# Patient Record
Sex: Female | Born: 1995 | Race: White | Hispanic: No | Marital: Single | Smoking: Never smoker
Health system: Southern US, Community
[De-identification: ages and names within clinical notes are randomized; demographics above are authoritative.]

## PROBLEM LIST (undated history)

## (undated) DIAGNOSIS — B019 Varicella without complication: Secondary | ICD-10-CM

## (undated) DIAGNOSIS — K219 Gastro-esophageal reflux disease without esophagitis: Secondary | ICD-10-CM

## (undated) HISTORY — DX: Varicella without complication: B01.9

## (undated) HISTORY — DX: Gastro-esophageal reflux disease without esophagitis: K21.9

---

## 2019-05-21 ENCOUNTER — Other Ambulatory Visit: Payer: Self-pay

## 2019-05-23 ENCOUNTER — Ambulatory Visit (INDEPENDENT_AMBULATORY_CARE_PROVIDER_SITE_OTHER): Payer: BC Managed Care – PPO | Admitting: Internal Medicine

## 2019-05-23 ENCOUNTER — Ambulatory Visit: Payer: Self-pay | Admitting: Internal Medicine

## 2019-05-23 ENCOUNTER — Other Ambulatory Visit: Payer: Self-pay

## 2019-05-23 ENCOUNTER — Encounter: Payer: Self-pay | Admitting: Internal Medicine

## 2019-05-23 VITALS — BP 110/70 | HR 75 | Ht 61.5 in | Wt 190.0 lb

## 2019-05-23 DIAGNOSIS — D352 Benign neoplasm of pituitary gland: Secondary | ICD-10-CM

## 2019-05-23 DIAGNOSIS — E221 Hyperprolactinemia: Secondary | ICD-10-CM | POA: Diagnosis not present

## 2019-05-23 DIAGNOSIS — E229 Hyperfunction of pituitary gland, unspecified: Secondary | ICD-10-CM | POA: Diagnosis not present

## 2019-05-23 DIAGNOSIS — R7989 Other specified abnormal findings of blood chemistry: Secondary | ICD-10-CM

## 2019-05-23 LAB — TSH: TSH: 2.6 u[IU]/mL (ref 0.35–4.50)

## 2019-05-23 LAB — PROLACTIN: Prolactin: 34.6 ng/mL — ABNORMAL HIGH

## 2019-05-23 NOTE — Patient Instructions (Signed)
Please stop at the lab.  Move Cabergoline at night.  I will let you know abut the Cabergoline dose when the results return.  Please return for another prolactin level in 1.5-2 months.  Please come back for a follow-up appointment in 1 year.

## 2019-05-23 NOTE — Progress Notes (Signed)
Patient ID: Victoria Sellers, female   DOB: 03-Aug-1996, 23 y.o.   MRN: [id number]    HPI  Victoria Sellers is a 23 y.o.-year-old female, referred by her PCP, Dr. 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  She moved from [place] and would like to establish care with endocrinology in [place].  Pt. has been found to have a high prolactin level in 2019 after she complained of bilateral breast discharge (for the previous 4 years).   At that time, she presented with: -Galactorrhea -Irregular menstrual cycles (skipped 6 months last year, prev. q3-4 months) -Weight gain (30 lbs in last 5 years) -Acne But no headaches.  Further investigation with a pituitary MRI was positive for a pituitary adenoma: Pituitary MRI (05/18/2018): 7 x 8 mm pituitary microadenoma within the right aspect of the pituitary gland  She was referred to endocrinology (Dr. Georgiann Mccoy, DO), and she was started on cabergoline.  She continues on 0.5 mg of Cabergoline twice a week (however, she was off the medication for the last 3 weeks since this was not refilled by previous endocrinologist due to her impending move to [place]).  She takes this in the morning.  She tolerates it well.  She denies: -Headaches -Dizziness -Congestion -Nausea But she does have: -Constipation  Also, her galactorrhea and irregular menses resolved after starting Cabergoline.  She describes that since she was Cabergoline, she started to have mild breast discharge again.  Patient prolactin levels were reviewed: 11/09/2018: Prolactin 14.23 10/15/2018: Prolactin 19.94 05/22/2018: FSH 6.2, LH 7.9, alpha subunit 0.1, IGF-I 133, ACTH 18, a.m. cortisol 10, free T4 1.09 -all normal 05/14/2018: Prolactin 105.7, TSH 1.75 No results found for: PROLACTIN  Patient's thyroid tests were also reviewed and these were normal: 05/14/2018: TSH 1.75 No results found for: TSH, FREET4   She does not have a history of  hypothyroidism. Mother has hypothyroidism.  She is not on Risperdal, oral contraceptives, Reglan.  Pt. also has a history of L femoral fx - playing soccer. She had surgery.  ROS: Constitutional: + weight gain, no weight loss, no fatigue, no subjective hyperthermia, no subjective hypothermia, no nocturia Eyes: no blurry vision, no xerophthalmia ENT: no sore throat, no nodules felt in neck, no dysphagia, no odynophagia, no hoarseness, no tinnitus, no hypoacusis Cardiovascular: no CP, no SOB, no palpitations, no leg swelling Respiratory: no cough, no SOB, no wheezing Gastrointestinal: no N, no V, no D, no C, no acid reflux Musculoskeletal: no muscle, no joint aches Skin: no rash, no hair loss Neurological: no tremors, no numbness or tingling/no dizziness/no HAs Psychiatric: no depression, no anxiety  Past medical history: Please see HPI  History reviewed. No pertinent surgical history.   Social History   Socioeconomic History  . Marital status: Single    Spouse name: Not on file  . Number of children: Not on file  . Years of education: Not on file  . Highest education level: Not on file  Occupational History  . Not on file  Social Needs  . Financial resource strain: Not on file  . Food insecurity    Worry: Not on file    Inability: Not on file  . Transportation needs    Medical: Not on file    Non-medical: Not on file  Tobacco Use  . Smoking status: Never Smoker  . Smokeless tobacco: Never Used  Substance and Sexual Activity  . Alcohol use: Not on file  . Drug use: Not on file  . Sexual activity:  Not on file  Lifestyle  . Physical activity    Days per week: Not on file    Minutes per session: Not on file  . Stress: Not on file  Relationships  . Social Herbalist on phone: Not on file    Gets together: Not on file    Attends religious service: Not on file    Active member of club or organization: Not on file    Attends meetings of clubs or  organizations: Not on file    Relationship status: Not on file  . Intimate partner violence    Fear of current or ex partner: Not on file    Emotionally abused: Not on file    Physically abused: Not on file    Forced sexual activity: Not on file  Other Topics Concern  . Not on file  Social History Narrative  . Not on file   Current Outpatient Medications  Medication Sig Dispense Refill  . cabergoline (DOSTINEX) 0.5 MG tablet Take 0.5 mg by mouth 2 (two) times a week.     No current facility-administered medications for this visit.     No Known Allergies   History reviewed. No pertinent family history.  Also, per HPI.  PE: BP 110/70   Pulse 75   Ht 5' 1.5" (1.562 m) Comment: measured today without shoes  Wt 190 lb (86.2 kg)   LMP 04/23/2019   SpO2 97%   BMI 35.32 kg/m  Wt Readings from Last 3 Encounters:  05/23/19 190 lb (86.2 kg)   Constitutional: overweight, in NAD Eyes: PERRLA, EOMI, no exophthalmos ENT: moist mucous membranes, no thyromegaly, no cervical lymphadenopathy Cardiovascular: RRR, No MRG Respiratory: CTA B Gastrointestinal: abdomen soft, NT, ND, BS+ Musculoskeletal: no deformities, strength intact in all 4 Skin: moist, warm, no rashes Neurological: no tremor with outstretched hands, DTR normal in all 4  ASSESSMENT: 1.  Hyperprolactinemia Patient with several years of galactorrhea, found to be related to hyperprolactinemia approximately 1 year ago.  We discussed about possible etiologies prolactin levels to include: A pituitary adenoma, pregnancy, hypothyroidism, stress, exercise, lack of sleep, certain medications, drugs, seizures, however, in her case, the most likely etiology is her pituitary microadenoma. -We also discussed about the effects of having a high prolactin: Galactorrhea, irregular menstrual cycles, increased weight, possibly acne/hirsutism, possibly headaches if related to a pituitary tumor -She did have galactorrhea, irregular menstrual  cycles, increased weight, and headaches which are all improved after starting the dopamine agonist (Cabergoline).  She is tolerating this well.  We discussed that this is the treatment of choice for hyperprolactinemia caused by a pituitary tumor. - we will recheck prolactin today, along with a TSH.  Of note, she has been off Cabergoline for approximately 3 weeks after he ran out of the medication.  She started to have breast discharge again, but mild. - We discussed about continuing Cabergoline for now but may need to change the dose based on her prolactin level.  Cabergoline has the advantage of being administered usually 1-2 times a week, is better tolerated, and has beneficial effects on pituitary tumor size.  Bromocriptine is taken several times a day, has more side effects, and has no effect on pituitary tumor size.  She agrees with the plan to start cabergoline if still needed - RTC for repeat prolactin level in 2 months if we change the dose of the cabergoline, and in 1 year for another visit  2. Pituitary microadenoma - Patient with pituitary  microadenoma,found during investigation for high prolactin level. - I reviewed the pituitary MRI report along with the patient. I explained that, since the tumor is under 1 cm, this qualifies as a micro-, rather than a macro-adenoma.  - I explained that this is not a "brain tumor". These tumors are extremely rarely malignant, the vast majority of them are benign.  - a pituitary adenoma can be:  Not producing hormones, not compressing the pituitary gland or the optic chiasm  Not producing hormones, but compressing either the pituitary gland (causing hypopituitarism) or the optic chiasm (causing visual field cuts)  Producing hormones: - Prolactin (prolactinoma) -usually manifests with galactorrhea, high prolactin level and irregular menses - ACTH (Cushing's disease) -usually manifests with weight gain-with specific central distribution of fat, wide,  purple, stretch marks, full supraclavicular fat pads, diabetes, hypertension.  Of note, her ACTH and cortisol level were normal. - growth hormone (acromegaly) - she denies an enlarged jaw, change in the size of rings or changing shoe sizes.  Her IGF-I level was normal. - LH or FSH (gonadotropin secreting tumor) -usually manifests with irregular menses in women, hyper/hypogonadism in men.  Her LH and FSH are normal, and she is now having regular menstrual cycles. - TSH (TSH secreting tumor) (very rare) -but TFTs and alpha subunit normal -We discussed about follow-up for pituitary tumor.  As long as her prolactin level remains controlled, we do not need to repeat the MRI. -We can discuss about a drug holiday approximately in 3 to 5 years if prolactin levels remain well suppressed until then.  Needs refills.  Component     Latest Ref Rng & Units 05/23/2019  TSH     0.35 - 4.50 uIU/mL 2.60  Prolactin     ng/mL 34.6 (H)  TSH is normal.  Prolactin is slightly high.  We will start Cabergoline at 0.25 mg twice a week and repeat her prolactin level in 1.5 to 2 months.  Philemon Kingdom, MD PhD [facility]

## 2019-05-24 ENCOUNTER — Other Ambulatory Visit: Payer: Self-pay | Admitting: Internal Medicine

## 2019-05-24 DIAGNOSIS — E221 Hyperprolactinemia: Secondary | ICD-10-CM

## 2019-05-24 MED ORDER — CABERGOLINE 0.5 MG PO TABS: 0.2500 mg | ORAL_TABLET | ORAL | 3 refills | Status: DC

## 2019-05-29 ENCOUNTER — Ambulatory Visit (INDEPENDENT_AMBULATORY_CARE_PROVIDER_SITE_OTHER): Payer: BC Managed Care – PPO | Admitting: Family Medicine

## 2019-05-29 ENCOUNTER — Encounter: Payer: Self-pay | Admitting: Family Medicine

## 2019-05-29 ENCOUNTER — Other Ambulatory Visit: Payer: Self-pay

## 2019-05-29 VITALS — BP 98/66 | HR 82 | Temp 96.7°F | Wt 189.0 lb

## 2019-05-29 DIAGNOSIS — R41840 Attention and concentration deficit: Secondary | ICD-10-CM | POA: Diagnosis not present

## 2019-05-29 DIAGNOSIS — R635 Abnormal weight gain: Secondary | ICD-10-CM

## 2019-05-29 DIAGNOSIS — D352 Benign neoplasm of pituitary gland: Secondary | ICD-10-CM | POA: Diagnosis not present

## 2019-05-29 DIAGNOSIS — Z7689 Persons encountering health services in other specified circumstances: Secondary | ICD-10-CM

## 2019-05-29 NOTE — Patient Instructions (Signed)

## 2019-05-29 NOTE — Progress Notes (Signed)
Patient presents to clinic today to establish care.  SUBJECTIVE: [name]: [name] with pmh sig for pituitary adenoma and GERD.  Pt previously seen by Victoria Sellers in [place], [place].  Pituitary adenoma: -d/x'd 1 yr ago -had amenorrhea and lactation at time of dx -was on cabergoline prior to moving to [place]. -had labs during recent visit with Dr. Cruzita Sellers -is to restart Caberoline soon  Attention concerns: -pt endorses waiting until the last minute to complete tasks -notes as a child her mother did not want her on meds. -having difficulty getting work done since working from home. -has never been on meds, but is interested in starting.  Weight gain: -pt was 130 lbs during college -feels like gained 20 lbs this yr -was more active in HS and college (ran, played soccer) -signed up to start [employer] -not cooking at home much.  States her roommates do not clean up the kitchen.  Allergies: nkda  Past Surg hx: L femur fx while playing soccer  Social hx:  Pt is single.  [place] is originally [place].  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  Pt denies tobacco and drug use.  Pt endorses social EtOH use.  Health Maintenance: Immunizations -- influenza 2019 PAP -- 2019  Family Med Hx: Mom- alive, asthma, thyroid issues Dad-alive, learning disability Sis- [employer]- alive, depression MGM-alive, EtOH abuse MGF-desc, MI, early death PGM-desc, DM PGF-alive, DM  Past Medical History:  Diagnosis Date  . Chicken pox   . GERD (gastroesophageal reflux disease)     History reviewed. No pertinent surgical history.  Current Outpatient Medications on File Prior to Visit  Medication Sig Dispense Refill  . cabergoline (DOSTINEX) 0.5 MG tablet Take 0.5 tablets (0.25 mg total) by mouth 2 (two) times a week. 12 tablet 3   No current facility-administered medications on file prior to visit.     No Known Allergies  History reviewed. No pertinent  family history.  Social History   Socioeconomic History  . Marital status: Single    Spouse name: Not on file  . Number of children: Not on file  . Years of education: Not on file  . Highest education level: Not on file  Occupational History  . Not on file  Social Needs  . Financial resource strain: Not on file  . Food insecurity    Worry: Not on file    Inability: Not on file  . Transportation needs    Medical: Not on file    Non-medical: Not on file  Tobacco Use  . Smoking status: Never Smoker  . Smokeless tobacco: Never Used  Substance and Sexual Activity  . Alcohol use: Yes  . Drug use: Never  . Sexual activity: Yes  Lifestyle  . Physical activity    Days per week: Not on file    Minutes per session: Not on file  . Stress: Not on file  Relationships  . Social Herbalist on phone: Not on file    Gets together: Not on file    Attends religious service: Not on file    Active member of club or organization: Not on file    Attends meetings of clubs or organizations: Not on file    Relationship status: Not on file  . Intimate partner violence    Fear of current or ex partner: Not on file    Emotionally abused: Not on file    Physically abused: Not  on file    Forced sexual activity: Not on file  Other Topics Concern  . Not on file  Social History Narrative  . Not on file    ROS General: Denies fever, chills, night sweats, changes in appetite   +weight gain, h/o pituitary adenoma HEENT: Denies headaches, ear pain, changes in vision, rhinorrhea, sore throat CV: Denies CP, palpitations, SOB, orthopnea Pulm: Denies SOB, cough, wheezing GI: Denies abdominal pain, nausea, vomiting, diarrhea, constipation GU: Denies dysuria, hematuria, frequency, vaginal discharge Msk: Denies muscle cramps, joint pains Neuro: Denies weakness, numbness, tingling Skin: Denies rashes, bruising Psych: Denies depression, anxiety, hallucinations  +difficulty concentrating.    BP 98/66 (BP Location: Left Arm, Patient Position: Sitting, Cuff Size: Large)   Pulse 82   Temp (!) 96.7 F (35.9 C) (Temporal)   Wt 189 lb (85.7 kg)   LMP 04/26/2019 (Exact Date)   SpO2 98%   BMI 35.13 kg/m   Physical Exam Gen. Pleasant, well developed, well-nourished, in NAD HEENT - Victoria [employer]ellers/AT, PERRL, no scleral icterus, no nasal drainage, pharynx without erythema or exudate.  TMs normal b/l Lungs: no use of accessory muscles, CTAB, no wheezes, rales or rhonchi Cardiovascular: RRR, No r/g/m, no peripheral edema Abdomen: BS present, soft, nontender,nondistended Musculoskeletal: No deformities, moves all four extremities, no cyanosis or clubbing, normal tone Neuro:  A&Ox3, CN II-XII intact, normal gait Skin:  Warm, dry, intact, no lesions   Recent Results (from the past 2160 hour(s))  TSH     Status: None   Collection Time: 05/23/19  1:32 PM  Result Value Ref Range   TSH 2.60 0.35 - 4.50 uIU/mL  Prolactin     Status: Abnormal   Collection Time: 05/23/19  1:34 PM  Result Value Ref Range   Prolactin 34.6 (H) ng/mL    Comment:             Reference Range  Females         Non-pregnant        3.0-30.0         Pregnant           10.0-209.0         Postmenopausal      2.0-20.0 . . .     Assessment/Plan: Pituitary adenoma (Victoria Sellers) -prolactin elevated at 34.6 on 05/23/19 -TSH normal at 2.60 on 05/23/19 -plans to restart Cabergoline -continue f/u with Endocrinology, Dr. Cruzita Sellers  Inattention -discussed ways to get organized a stay on task. -given handout -pt to contact [employer] Attention Specialist in regards to ADHD testing  Encounter to establish care -We reviewed the [name], PSH, FH, SH, Meds and Allergies. -We provided refills for any medications we will prescribe as needed. -We addressed current concerns per orders and patient instructions. -We have asked for records for pertinent exams, studies, vaccines and notes from previous providers. -We have advised patient to follow up  per instructions below.  Weight gain -last TSH 2.60 on 05/23/19 -discussed lifestyle modifications -will continue to evaluate  F/u prn  Victoria Mitts, MD

## 2019-05-31 ENCOUNTER — Encounter: Payer: Self-pay | Admitting: Family Medicine

## 2019-06-11 ENCOUNTER — Encounter: Payer: Self-pay | Admitting: Family Medicine

## 2019-06-18 NOTE — Telephone Encounter (Signed)
Spoke with pt provided pt with information regarding ADHD testing

## 2019-07-03 ENCOUNTER — Other Ambulatory Visit: Payer: BC Managed Care – PPO

## 2019-09-27 ENCOUNTER — Telehealth: Payer: Self-pay | Admitting: Internal Medicine

## 2019-09-27 NOTE — Telephone Encounter (Signed)
Patient requests to have thyroid lab orders added for her 10/01/19 appointment due to hair loss. Best ph# for patient is [phone]

## 2019-10-01 ENCOUNTER — Encounter: Payer: Self-pay | Admitting: Internal Medicine

## 2019-10-01 ENCOUNTER — Other Ambulatory Visit (INDEPENDENT_AMBULATORY_CARE_PROVIDER_SITE_OTHER): Payer: BC Managed Care – PPO

## 2019-10-01 ENCOUNTER — Other Ambulatory Visit: Payer: Self-pay

## 2019-10-01 ENCOUNTER — Other Ambulatory Visit: Payer: Self-pay | Admitting: Internal Medicine

## 2019-10-01 DIAGNOSIS — L659 Nonscarring hair loss, unspecified: Secondary | ICD-10-CM

## 2019-10-01 DIAGNOSIS — E221 Hyperprolactinemia: Secondary | ICD-10-CM | POA: Diagnosis not present

## 2019-10-02 LAB — T4, FREE: Free T4: 0.76 ng/dL (ref 0.60–1.60)

## 2019-10-02 LAB — TSH: TSH: 1.92 u[IU]/mL (ref 0.35–4.50)

## 2019-10-02 LAB — PROLACTIN: Prolactin: 19.8 ng/mL

## 2019-10-02 LAB — T3, FREE: T3, Free: 3.5 pg/mL (ref 2.3–4.2)

## 2020-01-02 ENCOUNTER — Ambulatory Visit: Payer: BC Managed Care – PPO | Attending: Family

## 2020-01-02 DIAGNOSIS — Z23 Encounter for immunization: Secondary | ICD-10-CM

## 2020-01-02 NOTE — Progress Notes (Signed)
   Covid-19 Vaccination Clinic  Name:  Victoria Sellers    MRN: [id number] DOB: 05-26-1996  01/02/2020  Victoria Sellers was observed post Covid-19 immunization for 15 minutes without incident. She was provided with Vaccine Information Sheet and instruction to access the V-Safe system.   Victoria Sellers was instructed to call 911 with any severe reactions post vaccine: Marland Kitchen Difficulty breathing  . Swelling of face and throat  . A fast heartbeat  . A bad rash all over body  . Dizziness and weakness   Immunizations Administered    Name Date Dose VIS Date Route   Moderna COVID-19 Vaccine 01/02/2020  4:23 PM 0.5 mL 08/2019 Intramuscular   Manufacturer: Moderna   Lot: [id number]   [id number]

## 2020-01-03 ENCOUNTER — Encounter: Payer: Self-pay | Admitting: Family Medicine

## 2020-01-03 ENCOUNTER — Telehealth (INDEPENDENT_AMBULATORY_CARE_PROVIDER_SITE_OTHER): Payer: BC Managed Care – PPO | Admitting: Family Medicine

## 2020-01-03 DIAGNOSIS — R6883 Chills (without fever): Secondary | ICD-10-CM

## 2020-01-03 DIAGNOSIS — Z7189 Other specified counseling: Secondary | ICD-10-CM

## 2020-01-03 DIAGNOSIS — M791 Myalgia, unspecified site: Secondary | ICD-10-CM | POA: Diagnosis not present

## 2020-01-03 NOTE — Progress Notes (Signed)
Virtual Visit via Video Note  I connected with Victoria Sellers on 01/03/20 at  1:00 PM EDT by a video enabled telemedicine application 2/2 [id number] pandemic and verified that I am speaking with the correct person using two identifiers.  Location patient: home Location provider:work or home office Persons participating in the virtual visit: patient, provider  I discussed the limitations of evaluation and management by telemedicine and the availability of in person appointments. The patient expressed understanding and agreed to proceed.   HPI: Pt is a 24 yo female with pmh sig for hyperprolactinemia 2/2 pituitary adenoma on cabergoline and GERD.  Pt received her 1st dose of Moderna COVID vaccine yesterday.  She developed chills, myalgias, injection site pain, unsure of fever as does not have temperature, nausea, and emesis around 2 am.  R arm extremely sore, no increased warmth or erythema.  Pt mentions only being able to use her R arm from the elbow down 2/2 the soreness.  Pt does note Denies HAs, sore throat, ear pain or pressure, cough.  Nausea has since resolved.  Has taken ibuprofen.   Denies sick contacts. Pt states she had to put on several layers of clothes to feel warm.  Will remove a layer as starting to feel hot.  ROS: See pertinent positives and negatives per HPI.  Past Medical History:  Diagnosis Date  . Chicken pox   . GERD (gastroesophageal reflux disease)     No past surgical history on file.  No family history on file.  Current Outpatient Medications:  .  cabergoline (DOSTINEX) 0.5 MG tablet, Take 0.5 tablets (0.25 mg total) by mouth 2 (two) times a week., Disp: 12 tablet, Rfl: 3  EXAM:  VITALS per patient if applicable: RR between 123456 bpm  GENERAL: alert, oriented, pt in several layers of clothes including a sweatshirt, appears fatigued, sick but non toxic and in NAD  HEENT: atraumatic, conjunctiva clear, no obvious abnormalities on inspection of external nose  and ears  NECK: normal movements of the head and neck  LUNGS: on inspection no signs of respiratory distress, breathing rate appears normal, no obvious gross SOB, gasping or wheezing  CV: no obvious cyanosis  MS: moves all visible extremities without noticeable abnormality  PSYCH/NEURO: pleasant and cooperative, no obvious depression or anxiety, speech and thought processing grossly intact  ASSESSMENT AND PLAN:  Discussed the following assessment and plan:  Educated about COVID-19 virus infection -Discussed signs and symptoms of COVID-19 virus -Discussed expected vaccine reactions including cold/flulike symptoms -Discussed supportive care including rest, hydration, Tylenol for any pain, fever or discomfort. -Given precautions  -For continued or worsening symptoms consider Covid testing.  Chills -2/2 expected vaccine rxn -Supportive care  Myalgia -2/2 expected vaccine rxn -Supportive care  Follow-up as needed   I discussed the assessment and treatment plan with the patient. The patient was provided an opportunity to ask questions and all were answered. The patient agreed with the plan and demonstrated an understanding of the instructions.   The patient was advised to call back or seek an in-person evaluation if the symptoms worsen or if the condition fails to improve as anticipated.   Billie Ruddy, MD

## 2020-01-28 ENCOUNTER — Ambulatory Visit: Payer: BC Managed Care – PPO | Attending: Family

## 2020-01-28 DIAGNOSIS — Z23 Encounter for immunization: Secondary | ICD-10-CM

## 2020-01-28 NOTE — Progress Notes (Signed)
   Covid-19 Vaccination Clinic  Name:  Victoria Sellers    MRN: [id number] DOB: December 11, 1995  01/28/2020  [name] was observed post Covid-19 immunization for 15 minutes without incident. She was provided with Vaccine Information Sheet and instruction to access the V-Safe system.   [name] was instructed to call 911 with any severe reactions post vaccine: Marland Kitchen Difficulty breathing  . Swelling of face and throat  . A fast heartbeat  . A bad rash all over body  . Dizziness and weakness   Immunizations Administered    Name Date Dose VIS Date Route   Moderna COVID-19 Vaccine 01/28/2020  3:40 PM 0.5 mL 08/2019 Intramuscular   Manufacturer: Moderna   Lot: [id number]   [id number]

## 2020-05-20 ENCOUNTER — Other Ambulatory Visit: Payer: Self-pay

## 2020-05-21 ENCOUNTER — Encounter: Payer: Self-pay | Admitting: Family Medicine

## 2020-05-21 ENCOUNTER — Ambulatory Visit (INDEPENDENT_AMBULATORY_CARE_PROVIDER_SITE_OTHER): Payer: BC Managed Care – PPO | Admitting: Family Medicine

## 2020-05-21 VITALS — BP 108/64 | HR 88 | Temp 98.0°F

## 2020-05-21 DIAGNOSIS — M79662 Pain in left lower leg: Secondary | ICD-10-CM | POA: Diagnosis not present

## 2020-05-21 DIAGNOSIS — S86112A Strain of other muscle(s) and tendon(s) of posterior muscle group at lower leg level, left leg, initial encounter: Secondary | ICD-10-CM | POA: Diagnosis not present

## 2020-05-21 NOTE — Patient Instructions (Addendum)
Medial Head Gastrocnemius Tear  Medial head gastrocnemius tear, also called tennis leg, is an injury to the inner part of the calf muscle. This injury may include overstretching of the muscle or a partial or complete tear. This is a common sports injury. Calf muscle tears usually occur near the back of the knee. This often causes sudden pain and muscle weakness. What are the causes? This condition is caused by forceful stretching or strain on the calf muscle. This usually happens when you forcefully push off of your foot. It may also happen if you forcefully straighten your knee while your foot is flat on the ground. What increases the risk? The following factors may make you more likely to develop this condition:  Being female and older than age 40.  Playing sports that involve: ? Quick increases in speed and changes of direction, such as tennis and soccer. ? Jumping, such as basketball. ? Running, especially uphill or on uneven ground. What are the signs or symptoms? Symptoms of this condition include:  Sudden pain in the back of the leg. You may hear a noise, like a pop or a snap at the time of injury.  Pain that gets worse when you bring your toes up toward your shin or when you straighten your knee.  Pain on the inside of your calf, from your knee to your ankle.  Pain when pressing on your calf muscle.  Swelling and bruising along your calf and lower leg, down to your ankle. This may worsen for the first 2 days before getting better.  Not being able to rise up on your toes.  Difficulty pushing off your foot when walking or using stairs. How is this diagnosed? This condition may be diagnosed based on:  Your symptoms and medical history.  A physical exam. Your health care provider may be able to feel a lump or a defect in your muscle.  An MRI or ultrasound to determine the severity and exact location of your injury. How is this treated? Treatment for this condition may  include:  Resting the muscle and keeping weight off your leg for several days. During this time, you may use crutches or another walking device.  Using a splint to keep your ankle or knee in a stable position.  Wearing a walking boot to decrease the use of your gastrocnemius muscle.  Using a wedge under your heel to reduce stretching of your healing muscle.  Wearing a compression sleeve around your calf muscle.  Icing the muscle.  Raising (elevating) your leg when resting.  Taking medicine for pain and swelling, such as NSAIDs or steroids.  Taking medicine for muscle spasms.  Doing leg exercises as told by your health care provider or physical therapist. Follow these instructions at home: Medicines  Take over-the-counter and prescription medicines only as told by your health care provider.  Ask your health care provider if the medicine prescribed to you requires you to avoid driving or using heavy machinery.  Talk with your health care provider before you take any medicines that contain aspirin. Aspirin increases your risk for bleeding at the injured area. If you have a splint, boot, or compression sleeve:  Wear it as told by your health care provider. Remove it only as told by your health care provider.  Loosen the splint, boot, or sleeve if your toes tingle, become numb, or turn cold and blue.  Keep the splint, boot, or sleeve clean.  If the splint, boot, or sleeve is not   waterproof: ? Do not let it get wet. ? Cover it with a watertight covering when you take a bath or shower. Managing pain, stiffness, and swelling   If directed, put ice on the injured area. ? If you have a removable splint, boot, or sleeve, remove it as told by your health care provider. ? Put ice in a plastic bag. ? Place a towel between your skin and the bag. ? Leave the ice on for 20 minutes, 2-3 times a day.  Move your toes often to reduce stiffness and swelling.  Elevate the injured area  above the level of your heart while you are sitting or lying down. Activity  Return gradually to your normal activities as told by your health care provider. Ask your health care provider what activities are safe for you.  Do not use the injured limb to support your body weight until your health care provider says that you can. Use crutches as told by your health care provider.  Do exercises as told by your health care provider.  Return to sporting activity only as told by your health care provider or physical therapist. Full recovery may take several months. General instructions  Ask your health care provider when it is safe to drive.  Do not use any products that contain nicotine or tobacco, such as cigarettes, e-cigarettes, and chewing tobacco. If you need help quitting, ask your health care provider.  Keep all follow-up visits as told by your health care provider. This is important. How is this prevented?  Warm up and stretch before being active.  Cool down and stretch after being active.  Give your body time to rest between periods of activity.  Make sure to use equipment that fits you.  Be safe and responsible while being active to avoid falls.  Maintain physical fitness, including: ? Strength. ? Flexibility. Contact a health care provider if:  Your symptoms do not improve with rest and treatment. Get help right away if:  You have swelling or redness in your calf that is getting worse.  Your skin or toenails turn blue or gray, feel cold, or become numb. Summary  Medial head gastrocnemius tear, also called tennis leg, is an injury to the inner part of the calf muscle.  Follow instructions as told by your health care provider for resting, icing, compressing, and elevating your leg.  Take over-the-counter and prescription medicines only as told by your health care provider.  Contact a health care provider if your symptoms do not improve with rest and  treatment. This information is not intended to replace advice given to you by your health care provider. Make sure you discuss any questions you have with your health care provider. Document Revised: 07/18/2018 Document Reviewed: 07/18/2018 Elsevier Patient Education  2020 [employer].  

## 2020-05-21 NOTE — Progress Notes (Signed)
Subjective:    Patient ID: Victoria Sellers, female    DOB: [birth year], 24 y.o.   MRN: [id number]  No chief complaint on file.   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  Pt endorses pulling L calf muscle at [employer] practice last Wed (8 days ago).  Pt was able to continue practicing and daily activities with some discomfort.  On Tuesday, 05/20/2019 while at practice pt heard a pop in L calf, leg gave out.  Patient describes the pain as being "shot in my leg" and was unable to bear weight.  Pt endorses edema, ecchymosis, pain, limited ROM of left ankle since the injury.  Pt tried ice, compression, elevation, and ibuprofen for symptoms.  Past Medical History:  Diagnosis Date  . Chicken pox   . GERD (gastroesophageal reflux disease)     No Known Allergies  ROS General: Denies fever, chills, night sweats, changes in weight, changes in appetite HEENT: Denies headaches, ear pain, changes in vision, rhinorrhea, sore throat CV: Denies CP, palpitations, SOB, orthopnea Pulm: Denies SOB, cough, wheezing GI: Denies abdominal pain, nausea, vomiting, diarrhea, constipation GU: Denies dysuria, hematuria, frequency, vaginal discharge Msk: Denies muscle cramps, joint pains  +L calf edema, ecchymosis, and pain Neuro: Denies weakness, numbness, tingling Skin: Denies rashes, bruising Psych: Denies depression, anxiety, hallucinations     Objective:    Blood pressure 108/64, pulse 88, temperature 98 F (36.7 C), temperature source Oral, SpO2 98 %.   Gen. Pleasant, well-nourished, in no distress, normal affect   HEENT: [place]/AT, face symmetric, conjunctiva clear, no scleral icterus, PERRLA, EOMI, nares patent without drainage Lungs: no accessory muscle use Cardiovascular: RRR, no m/r/g, no peripheral edema Abdomen: BS present, soft, NT/ND, no hepatosplenomegaly. Musculoskeletal: LLE  >RLE.  Left lower extremity compartments soft.  DP and PT pulses slightly diminished.good cap refill on left foot.  Limited ROM of left ankle 2/2 pain. dorsiflexion and plantar flexion of L foot decreased.  TTP of left calf.  No visible deformities.  Knee without TTP at joint line.  No cyanosis or clubbing. Neuro:  A&Ox3, CN II-XII intact, pt in transport wheelchair.  Ambulation not assessed Skin:  Warm, no lesions/ rash.  Skin of the left lower leg appears slightly shiny and tight compared to right.   Wt Readings from Last 3 Encounters:  05/29/19 189 lb (85.7 kg)  05/23/19 190 lb (86.2 kg)    Lab Results  Component Value Date   TSH 1.92 10/01/2019    Assessment/Plan:  Tear of left gastrocnemius muscle, initial encounter  - Plan: Ambulatory referral to Orthopedic Surgery  Pain of left calf -Likely 2/2 left gastrocnemius tear as unable to bear wt. -Compartments soft on exam pulses present -Discussed supportive care -Referral placed to Ortho for further evaluation and imaging.  Pt has appt today at 11 am [place]. - Plan: Ambulatory referral to Orthopedic Surgery  F/u prn  Grier Mitts, MD

## 2020-06-24 ENCOUNTER — Other Ambulatory Visit: Payer: Self-pay | Admitting: Internal Medicine

## 2020-06-24 NOTE — Telephone Encounter (Signed)
Needs appt for further refills.

## 2020-06-24 NOTE — Telephone Encounter (Signed)
Last OV 05/23/19 Last refill---1 year ago. Please advise for refill

## 2020-11-07 ENCOUNTER — Other Ambulatory Visit: Payer: Self-pay | Admitting: Internal Medicine

## 2020-12-13 ENCOUNTER — Other Ambulatory Visit: Payer: Self-pay | Admitting: Internal Medicine

## 2021-02-18 ENCOUNTER — Telehealth: Payer: Self-pay | Admitting: Internal Medicine

## 2021-02-18 NOTE — Telephone Encounter (Signed)
Pt calling in stating that she would like to donate plasma. Pt will be sending over a fax to [phone]

## 2021-02-18 NOTE — Telephone Encounter (Signed)
Noted  

## 2021-02-23 NOTE — Telephone Encounter (Signed)
Pt came on 02/23/21 at 12:13pm to drop off plasma paperwork, to be filled out by Dr.Gherghe.. placed in Provider's tray at front desk

## 2021-03-31 ENCOUNTER — Telehealth: Payer: Self-pay | Admitting: Internal Medicine

## 2021-03-31 NOTE — Telephone Encounter (Signed)
MEDICATION: cabergoline  PHARMACY:   [facility] [id number] - [place], [place] - [address] AT [place] Phone:  [phone]  Fax:  [phone]      HAS THE PATIENT CONTACTED THEIR PHARMACY?  no  IS THIS A 90 DAY SUPPLY : yes  IS PATIENT OUT OF MEDICATION: yes  IF NOT; HOW MUCH IS LEFT:   LAST APPOINTMENT DATE: '@6'$ /16/2022  NEXT APPOINTMENT DATE:'@8'$ /30/2022  DO WE HAVE YOUR PERMISSION TO LEAVE A DETAILED MESSAGE?: yes   **Let patient know to contact pharmacy at the end of the day to make sure medication is ready. **  ** Please notify patient to allow 48-72 hours to process**  **Encourage patient to contact the pharmacy for refills or they can request refills through [facility]**

## 2021-03-31 NOTE — Telephone Encounter (Signed)
Per Lenna Sciara, there will be no charge for this pt's paperwork completion this time.

## 2021-04-02 NOTE — Telephone Encounter (Signed)
Patient called to follow up with refill for cabergoline

## 2021-04-13 NOTE — Telephone Encounter (Signed)
Patient requests to be called asap at ph# [phone] re: Status of Forms/paperwork that Patient had dropped off in early June 2022 and was told on July 27th that the forms were ready to be picked up by the Patient, however, when Patient came in to pick up the forms on 04/07/21 no one could locate them.

## 2021-04-14 NOTE — Telephone Encounter (Signed)
Awaiting communication of where paperwork may possibly be. Will follow up with pt when possible.

## 2021-05-04 ENCOUNTER — Other Ambulatory Visit: Payer: Self-pay

## 2021-05-04 ENCOUNTER — Ambulatory Visit (INDEPENDENT_AMBULATORY_CARE_PROVIDER_SITE_OTHER): Payer: BC Managed Care – PPO | Admitting: Internal Medicine

## 2021-05-04 ENCOUNTER — Encounter: Payer: Self-pay | Admitting: Internal Medicine

## 2021-05-04 VITALS — BP 118/70 | HR 60 | Ht 61.5 in | Wt 184.6 lb

## 2021-05-04 DIAGNOSIS — E221 Hyperprolactinemia: Secondary | ICD-10-CM | POA: Diagnosis not present

## 2021-05-04 DIAGNOSIS — D352 Benign neoplasm of pituitary gland: Secondary | ICD-10-CM

## 2021-05-04 LAB — TSH: TSH: 2.25 u[IU]/mL (ref 0.35–5.50)

## 2021-05-04 NOTE — Patient Instructions (Addendum)
Please stop at the lab.  Continue off Cabergoline for now.  Please come back for a follow-up appointment in 1 year.

## 2021-05-04 NOTE — Progress Notes (Signed)
Patient ID: Victoria Sellers, female   DOB: 08-Jan-1996, 25 y.o.   MRN: [id number]   This visit occurred during the SARS-CoV-2 public health emergency.  Safety protocols were in place, including screening questions prior to the visit, additional usage of staff PPE, and extensive cleaning of exam room while observing appropriate contact time as indicated for disinfecting solutions.   HPI  Victoria Sellers is a 25 y.o.-year-old female, initially referred by her PCP, Dr. 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  She moved from [place] and would like to establish care with endocrinology in [place].  Our last visit was 2 years ago.  She was lost to follow-up afterwards.  Interim history: She is off Cabergoline for last 3 mo as she could not get refills.  She had to miss her last appointment as she developed COVID-19.  This was a mild form. She denies galactorrhea, headaches, visual changes, irregular menstrual cycles. No weight gain - she actually lost 5 lbs since last in-person visit.  She continues to have some acne associated with menses. She had a calf mm tear in 05/2020 while running (training for half marathon) >> now resolved.  She restarted training (the race is in [place] on 08/07/2021).  Reviewed and addended history: Pt. has been found to have a high prolactin level in 2019 after she complained of bilateral breast discharge (for the previous 4 years).   At that time, she presented with: -Galactorrhea -Irregular menstrual cycles (skipped 6 months last year, prev. q3-4 months) -Weight gain (30 lbs in last 5 years) -Acne But no headaches.  Further investigation with a pituitary MRI was positive for a pituitary adenoma: Pituitary MRI (05/18/2018): 7 x 8 mm pituitary microadenoma within the right aspect of the pituitary gland  She was referred to endocrinology (Dr. Georgiann Mccoy, DO), and she was started on cabergoline.  She  continued on 0.5 mg of Cabergoline twice a week.  Her galactorrhea and irregular menstrual cycles resolved after starting Cabergoline.    However, she was off the medication for 3 weeks before our visit from 2020, since this was not refilled by previous endocrinologist due to her impending move to [facility]).  She was taking this in the morning with good tolerance.   At last visit, she described that since she was Cabergoline, she started to have mild breast discharge again.  A prolactin level returned mildly elevated, at 34.6.    We restarted Cabergoline 0.25 mg twice a week in 05/2019.  A subsequent prolactin level was normal.  However, as mentioned above, she came off the medication in 01/2021 as she could not get refills anymore due to lack of follow-up.  Patient prolactin levels were reviewed: Lab Results  Component Value Date   PROLACTIN 19.8 10/01/2019   PROLACTIN 34.6 (H) 05/23/2019  11/09/2018: Prolactin 14.23 10/15/2018: Prolactin 19.94 05/22/2018: FSH 6.2, LH 7.9, alpha subunit 0.1, IGF-I 133, ACTH 18, a.m. cortisol 10, free T4 1.09 -all normal 05/14/2018: Prolactin 105.7, TSH 1.75  Patient's thyroid tests were also reviewed and these were normal: Lab Results  Component Value Date   TSH 1.92 10/01/2019   TSH 2.60 05/23/2019   FREET4 0.76 10/01/2019  05/14/2018: TSH 1.75  She does not have a history of hypothyroidism. Mother has hypothyroidism.  She is not on Risperdal, oral contraceptives, Reglan.  Pt. also has a history of L femoral fx - playing soccer. She had surgery.  ROS: Constitutional: no weight gain, no weight loss, no fatigue,  no subjective hyperthermia, no subjective hypothermia, no nocturia Eyes: no blurry vision, no xerophthalmia ENT: no sore throat, no nodules felt in neck, no dysphagia, no odynophagia, no hoarseness, no tinnitus, no hypoacusis Cardiovascular: no CP, no SOB, no palpitations, no leg swelling Respiratory: no cough, no SOB, no  wheezing Gastrointestinal: no N, no V, no D, no C, no acid reflux Musculoskeletal: no muscle, no joint aches Skin: no rash, no hair loss Neurological: no tremors, no numbness or tingling/no dizziness/no HAs  Past medical history: Please see HPI  No past surgical history on file.   Social History   Socioeconomic History   Marital status: Single    Spouse name: Not on file   Number of children: Not on file   Years of education: Not on file   Highest education level: Not on file  Occupational History   Not on file  Tobacco Use   Smoking status: Never   Smokeless tobacco: Never  Substance and Sexual Activity   Alcohol use: Yes   Drug use: Never   Sexual activity: Yes  Other Topics Concern   Not on file  Social History Narrative   Not on file   Social Determinants of Health   Financial Resource Strain: Not on file  Food Insecurity: Not on file  Transportation Needs: Not on file  Physical Activity: Not on file  Stress: Not on file  Social Connections: Not on file  Intimate Partner Violence: Not on file   Current Outpatient Medications  Medication Sig Dispense Refill   cabergoline (DOSTINEX) 0.5 MG tablet TAKE 1/2 TABLET BY MOUTH TWICE A WEEK 4 tablet 0   No current facility-administered medications for this visit.    No Known Allergies   No family history on file.  Also, per HPI.  PE: BP 118/70 (BP Location: Right Arm, Patient Position: Sitting, Cuff Size: Normal)   Pulse 60   Ht 5' 1.5" (1.562 m)   Wt 184 lb 9.6 oz (83.7 kg)   SpO2 98%   BMI 34.32 kg/m  Wt Readings from Last 3 Encounters:  05/04/21 184 lb 9.6 oz (83.7 kg)  05/29/19 189 lb (85.7 kg)  05/23/19 190 lb (86.2 kg)   Constitutional: overweight, in NAD Eyes: PERRLA, EOMI, no exophthalmos ENT: moist mucous membranes, no thyromegaly, no cervical lymphadenopathy Cardiovascular: RRR, No MRG Respiratory: CTA B Gastrointestinal: abdomen soft, NT, ND, BS+ Musculoskeletal: no deformities, strength  intact in all 4 Skin: moist, warm, no rashes Neurological: no tremor with outstretched hands, DTR normal in all 4  ASSESSMENT: 1.  Hyperprolactinemia Patient with history of several years of galactorrhea, found to be related to hyperprolactinemia in 2019.  During investigation for hyperprolactinemia, she was found to have a pituitary microadenoma. -we restarted Cabergoline (at the lower dose, 0.25 mg twice a week) at last visit after being off for approximately 3 weeks after she ran out of the medication.  She restarted to have breast discharge, but this was mild.  At that time, prolactin level was slightly high, at 34.  After restarting Cabergoline, I advised her to return in 1 to 2 months for a repeat prolactin level (this was normal, at 19) but she was lost for follow-up for 2 years afterwards -At last visit, we discussed about continuing Cabergoline for at least 5 years, and then possibly keeping her a drug holiday. -Of note, she tolerated Cabergoline well, without nausea, dizziness, headache, congestion -At this visit, she tells me that she had to come off Cabergoline after she  ran out of the medication in 01/2021 and could not refill it. -she denies headaches, irregular menstrual cycles, galactorrhea, acne, weight gain -We will recheck the prolactin level at this visit and see if she needs to restart Cabergoline.  We discussed that if her prolactin level is only slightly elevated, due to the lack of symptoms, we may just need to follow this up without needing to restart Cabergoline.  She agrees with the plan. -I will see her back in a year, but sooner for labs  2. Pituitary microadenoma -She has a history of pituitary microadenoma found during investigation for high prolactin level -An investigation for the rest of the pituitary function was normal (ACTH, cortisol, IGF-I, LH, FSH, TFTs).  We do not need to repeat these, since she does not have any clinical features of Cushing's disease,  acromegaly, or other pituitary tumors. -She denies blurry vision or headaches -As long as her prolactin levels remain controlled, we do not need to repeat the MRI -We again discussed about considering this 3 months off Cabergoline is a drug holiday and see how her prolactin level is changed during this period  Component     Latest Ref Rng & Units 05/04/2021  TSH     0.35 - 5.50 uIU/mL 2.25  Prolactin     ng/mL 37.5 (H)  TSH is normal but prolactin is slightly high.  Since she is asymptomatic, I would suggest to recheck her prolactin level in 1 to 2 months to see if we need to restart Cabergoline.  Philemon Kingdom, MD PhD [facility]

## 2021-05-05 LAB — PROLACTIN: Prolactin: 37.5 ng/mL — ABNORMAL HIGH

## 2021-05-20 ENCOUNTER — Encounter (HOSPITAL_COMMUNITY): Payer: Self-pay

## 2021-05-20 ENCOUNTER — Other Ambulatory Visit: Payer: Self-pay

## 2021-05-20 ENCOUNTER — Ambulatory Visit (HOSPITAL_COMMUNITY)
Admission: EM | Admit: 2021-05-20 | Discharge: 2021-05-20 | Disposition: A | Discharge status: Home / Self Care | Payer: BC Managed Care – PPO | Attending: Family Medicine | Admitting: Family Medicine

## 2021-05-20 DIAGNOSIS — L03032 Cellulitis of left toe: Secondary | ICD-10-CM | POA: Diagnosis not present

## 2021-05-20 MED ORDER — DOXYCYCLINE HYCLATE 100 MG PO CAPS: 100.0000 mg | ORAL_CAPSULE | Freq: Two times a day (BID) | ORAL | 0 refills | Status: AC

## 2021-05-20 NOTE — ED Triage Notes (Signed)
Pt presents with swelling and pain to her left big toe x 5 days. Reports concern for ingrown toenail. Has done warm soaks and ibuprofen at home with no relief.

## 2021-05-20 NOTE — ED Provider Notes (Signed)
  Victoria Sellers   [id number] 05/20/21 Arrival Time: [id number]  ASSESSMENT & PLAN:  1. Paronychia of great toe, left    No indication for I&D at this time. Very early development with fluctuance.  Begin: Meds ordered this encounter  Medications   doxycycline (VIBRAMYCIN) 100 MG capsule    Sig: Take 1 capsule (100 mg total) by mouth 2 (two) times daily.    Dispense:  14 capsule    Refill:  0    Recommend:  Follow-up Information     Schedule an appointment as soon as possible for a visit  with [facility]).   Why: If worsening or failing to improve as anticipated. Contact information: [address] [place],  [place]  [address]  [phone]                Reviewed expectations re: course of current medical issues. Questions answered. Outlined signs and symptoms indicating need for more acute intervention. Patient verbalized understanding. After Visit Summary given.   SUBJECTIVE:  Victoria Sellers is a 25 y.o. female who presents with a possible infection of her L great toe. Onset gradual, sev days ago; has trimmed toenail back with some relief; without active drainage and with mild bleeding. Symptoms have stabilized since beginning. Fever: absent. OTC/home treatment: none.  OBJECTIVE:  Vitals:   05/20/21 0922  BP: 118/71  Pulse: 69  Resp: 19  Temp: 98 F (36.7 C)  SpO2: 99%    General appearance: alert; no distress L great toe: erythema around medial nailfold; mild TTP; tender to touch; no active drainage or bleeding Psychological: alert and cooperative; normal mood and affect  No Known Allergies  Past Medical History:  Diagnosis Date   Chicken pox    GERD (gastroesophageal reflux disease)    Social History   Socioeconomic History   Marital status: Single    Spouse name: Not on file   Number of children: Not on file   Years of education: Not on file   Highest education level: Not on file  Occupational  History   Not on file  Tobacco Use   Smoking status: Never   Smokeless tobacco: Never  Substance and Sexual Activity   Alcohol use: Yes   Drug use: Never   Sexual activity: Yes  Other Topics Concern   Not on file  Social History Narrative   Not on file   Social Determinants of Health   Financial Resource Strain: Not on file  Food Insecurity: Not on file  Transportation Needs: Not on file  Physical Activity: Not on file  Stress: Not on file  Social Connections: Not on file   Family History  Problem Relation Age of Onset   Healthy Mother    Healthy Father    History reviewed. No pertinent surgical history.          Vanessa Kick, MD 05/20/21 1012

## 2021-06-29 ENCOUNTER — Other Ambulatory Visit: Payer: Self-pay

## 2021-06-29 ENCOUNTER — Other Ambulatory Visit (INDEPENDENT_AMBULATORY_CARE_PROVIDER_SITE_OTHER): Payer: BC Managed Care – PPO

## 2021-06-29 DIAGNOSIS — D352 Benign neoplasm of pituitary gland: Secondary | ICD-10-CM

## 2021-06-29 DIAGNOSIS — E221 Hyperprolactinemia: Secondary | ICD-10-CM

## 2021-06-30 LAB — PROLACTIN: Prolactin: 32 ng/mL — ABNORMAL HIGH

## 2021-10-14 ENCOUNTER — Encounter: Payer: Self-pay | Admitting: Family Medicine

## 2021-10-14 ENCOUNTER — Ambulatory Visit (INDEPENDENT_AMBULATORY_CARE_PROVIDER_SITE_OTHER): Payer: BC Managed Care – PPO | Admitting: Family Medicine

## 2021-10-14 VITALS — BP 110/62 | HR 70 | Temp 97.5°F | Wt 196.8 lb

## 2021-10-14 DIAGNOSIS — S060X0A Concussion without loss of consciousness, initial encounter: Secondary | ICD-10-CM

## 2021-10-14 DIAGNOSIS — D352 Benign neoplasm of pituitary gland: Secondary | ICD-10-CM | POA: Diagnosis not present

## 2021-10-14 DIAGNOSIS — T148XXA Other injury of unspecified body region, initial encounter: Secondary | ICD-10-CM

## 2021-10-14 NOTE — Progress Notes (Signed)
Subjective:    Patient ID: Victoria Sellers, female    DOB: 06-21-1996, 26 y.o.   MRN: [id number]  Chief Complaint  Patient presents with   Concussion    X 4 days, playing rugby, hit right temple     HPI Patient is a 26 year old female with past medical history significant for hyperprolactinemia, pituitary adenoma on cabergoline, GERD, h/o tear of left gastrocnemius muscle who was seen today for acute concern.  Pt concerned she may have a concussion.  Patient states 4 days ago while tackling another player during a rugby game she hit the right side of her head against the other player's knee.  Patient denies LOC at time of injury.  She was able to continue playing through the rest of the game.  Patient has since noticed edema of right temple, headache/increased head pressure, nausea, difficulty focusing, and lethargy.  Pain/pressure in head starts at right temple moves across frontal area and back to parietal areas.  Patient states that focusing in on something her peripheral vision disappears.  Patient denies emesis, insomnia, increased irritability.  Patient denies prior history of concussion or current use of blood thinners.  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  Pt inquires about checking on known h/o hyperprolactinemia she has not had recent imaging.  Followed by endocrinology.  Currently taking cabergoline.  Past Medical History:  Diagnosis Date   Chicken pox    GERD (gastroesophageal reflux disease)    No Known Allergies  ROS General: Denies fever, chills, night sweats, changes in weight, changes in appetite + lethargy HEENT: Denies headaches, ear pain, changes in vision, rhinorrhea, sore throat  + headache/increase head pressure CV: Denies CP, palpitations, SOB, orthopnea Pulm: Denies SOB, cough, wheezing GI: Denies abdominal pain, nausea, vomiting, diarrhea, constipation GU: Denies dysuria, hematuria, frequency, vaginal discharge Msk:  Denies muscle cramps, joint pains Neuro: Denies weakness, numbness, tingling Skin: Denies rashes, bruising + edema at right temple Psych: Denies depression, anxiety, hallucinations     Objective:    Blood pressure 110/62, pulse 70, temperature (!) 97.5 F (36.4 C), temperature source Oral, weight 196 lb 12.8 oz (89.3 kg), SpO2 98 %.  Gen. Pleasant, well-nourished, in no distress, normal affect   HEENT: [place]/AT, face symmetric, conjunctiva clear, no scleral icterus, PERRLA, EOMI, no nystagmus, nares patent without drainage, tongue midline, pharynx without erythema or exudate.  TMs normal bilaterally.  No cervical lymphadenopathy. Lungs: no accessory muscle use, CTAB, no wheezes or rales Cardiovascular: RRR, no m/r/g, no peripheral edema Abdomen: BS present, soft, NT/ND Musculoskeletal: Slightly decreased grip strength in right hand compared to left.  Normal strength in bilateral upper extremities, lower extremities, neck, and shoulders.  no deformities of skull, no cyanosis or clubbing, normal tone Neuro:  A&Ox3, CN II-XII intact, normal gait Skin:  Warm, no lesions/ rash.  No raccoon eyes or battle sign.  Soft tissue edema of right temple.   Wt Readings from Last 3 Encounters:  10/14/21 196 lb 12.8 oz (89.3 kg)  05/04/21 184 lb 9.6 oz (83.7 kg)  05/29/19 189 lb (85.7 kg)    Lab Results  Component Value Date   TSH 2.25 05/04/2021    Assessment/Plan:  Concussion without loss of consciousness, initial encounter  -Discussed symptoms likely associated with concussion.  No red flag symptoms. -Discussed mental rest and gradual return to play -Patient given a note for work -Tylenol as needed -Given strict precautions -For continued or worsening symptoms follow-up with concussion clinic. -  Plan: MR Brain Wo Contrast, Basic metabolic panel  Hematoma -Soft tissue edema of right temple improving -Supportive care including ice  Pituitary adenoma ([place])  -With history of  hyperprolactinemia -Currently asymptomatic -Continue cabergoline -Continue follow-up with endocrine -Last MRI brain 2019 - Plan: MR Brain Wo Contrast, Prolactin  F/u as needed  Grier Mitts, MD

## 2021-10-15 ENCOUNTER — Telehealth: Payer: Self-pay | Admitting: Family Medicine

## 2021-10-15 LAB — BASIC METABOLIC PANEL
BUN: 11 mg/dL (ref 6–23)
CO2: 28 mEq/L (ref 19–32)
Calcium: 9.3 mg/dL (ref 8.4–10.5)
Chloride: 104 mEq/L (ref 96–112)
Creatinine, Ser: 0.92 mg/dL (ref 0.40–1.20)
GFR: 86.4 mL/min (ref >60.00)
Glucose, Bld: 68 mg/dL — ABNORMAL LOW (ref 70–99)
Potassium: 4.2 mEq/L (ref 3.5–5.1)
Sodium: 139 mEq/L (ref 135–145)

## 2021-10-15 LAB — PROLACTIN: Prolactin: 30.9 ng/mL — ABNORMAL HIGH

## 2021-10-15 NOTE — Telephone Encounter (Signed)
Pt has viewed her blood work on [employer] and has questions

## 2021-10-18 NOTE — Telephone Encounter (Signed)
Blood sugar was 68 at time of blood draw.  Unclear on timing of last meal during that visit.  Would recommend an appointment to further evaluate.

## 2021-10-18 NOTE — Telephone Encounter (Signed)
Pt is concerned of low sugar levels. Pt stated she has a hx of low blood sugar but was never dx with hypoglycemia. Pt stated that she has been having fainting spells lately. Pt articulated that she tried to keep OJ and other foods high in sugar to keep from fainting.   Please advise

## 2021-10-19 NOTE — Telephone Encounter (Signed)
Patient notified of update  and verbalized understanding. Pt will call back once she gets her MRI scheduled.

## 2021-10-19 NOTE — Telephone Encounter (Signed)
Pt is aware [facility] will be calling her to sch MRI and that MRI has been authorization per sherri

## 2021-10-22 ENCOUNTER — Ambulatory Visit: Payer: BC Managed Care – PPO | Admitting: Family Medicine

## 2021-10-22 NOTE — Telephone Encounter (Signed)
Last Ov 10/14/21 Please advise

## 2021-10-22 NOTE — Telephone Encounter (Signed)
Patient is calling in to see if she needed to cancel her appointment today because she haven't got her MRI.  Please route back to me asap if she does so I cancel so she cannot get a penalty.  Please advise.

## 2021-10-25 ENCOUNTER — Ambulatory Visit
Admission: RE | Admit: 2021-10-25 | Discharge: 2021-10-25 | Disposition: A | Discharge status: Home / Self Care | Payer: BC Managed Care – PPO | Source: Ambulatory Visit | Attending: Family Medicine | Admitting: Family Medicine

## 2021-10-25 DIAGNOSIS — S060X0A Concussion without loss of consciousness, initial encounter: Secondary | ICD-10-CM

## 2021-10-25 DIAGNOSIS — D352 Benign neoplasm of pituitary gland: Secondary | ICD-10-CM

## 2021-10-25 NOTE — Telephone Encounter (Signed)
Matter addressed last week.  Patient was advised to proceed with imaging.

## 2021-10-27 ENCOUNTER — Encounter: Payer: Self-pay | Admitting: Family Medicine

## 2021-10-27 ENCOUNTER — Ambulatory Visit (INDEPENDENT_AMBULATORY_CARE_PROVIDER_SITE_OTHER): Payer: BC Managed Care – PPO | Admitting: Family Medicine

## 2021-10-27 VITALS — BP 128/83 | HR 84 | Temp 98.0°F | Wt 200.0 lb

## 2021-10-27 DIAGNOSIS — D352 Benign neoplasm of pituitary gland: Secondary | ICD-10-CM

## 2021-10-27 DIAGNOSIS — E162 Hypoglycemia, unspecified: Secondary | ICD-10-CM | POA: Diagnosis not present

## 2021-10-27 DIAGNOSIS — S060X0D Concussion without loss of consciousness, subsequent encounter: Secondary | ICD-10-CM | POA: Diagnosis not present

## 2021-10-27 NOTE — Progress Notes (Signed)
Subjective:    Patient ID: Victoria Sellers, female    DOB: 12/28/1995, 26 y.o.   MRN: [id number]  Chief Complaint  Patient presents with   Follow-up    MRI f/u and reevaluate concussion. Needs to be cleared before returning to work.    HPI Patient was seen today for f/u s/p concussion.  Concussion occurred 10/10/2021 during a rugby game.  Pt was able to continue playing in the game at time of injury.  Patient later seen in clinic on 10/14/2021 for symptoms including soft tissue edema right temple, headache, increased head pressure, nausea, difficulty focusing, lethargy, changes in peripheral vision.  Pt had MRI brain without contrast on 10/25/21 that was largely unremarkable.  Since last OFV patient reports resolution of symptoms.  No longer having headaches, photophobia, nausea, vomiting, fatigue.  Able to drive without issue.  Patient felt good during [employer].  Edema of right temple has resolved.  Pt mentions feeling a small bump underneath the skin on R temple.  Patient inquires about low blood sugar of 67 on labs from 10/14/2021.  Endorses history of hypoglycemia as a child after having a fasting blood test x3.  Patient eats regular meals but often notices dizziness/lightheadedness between meals.  Past Medical History:  Diagnosis Date   Chicken pox    GERD (gastroesophageal reflux disease)     No Known Allergies  ROS General: Denies fever, chills, night sweats, changes in weight, changes in appetite HEENT: Denies headaches, ear pain, changes in vision, rhinorrhea, sore throat CV: Denies CP, palpitations, SOB, orthopnea Pulm: Denies SOB, cough, wheezing GI: Denies abdominal pain, nausea, vomiting, diarrhea, constipation GU: Denies dysuria, hematuria, frequency, vaginal discharge Msk: Denies muscle cramps, joint pains Neuro: Denies weakness, numbness, tingling Skin: Denies rashes, bruising  +small bump on R side of head Psych: Denies depression, anxiety,  hallucinations    Objective:    Blood pressure 128/83, pulse 84, temperature 98 F (36.7 C), temperature source Oral, weight 200 lb (90.7 kg), SpO2 98 %.  Gen. Pleasant, well-nourished, in no distress, normal affect   HEENT: [place]/AT, face symmetric, conjunctiva clear, no scleral icterus, PERRLA, EOMI, nares patent without drainage, pharynx without erythema or exudate. TMs normal b/l.  no cervical lymphadenopathy. Lungs: no accessory muscle use, CTAB, no wheezes or rales Cardiovascular: RRR, no m/r/g, no peripheral edema Abdomen: BS present, soft, NT/ND Musculoskeletal: No deformities, no cyanosis or clubbing, normal tone Neuro:  A&Ox3, CN II-XII intact, normal gait Skin:  Warm, no lesions/ rash.  3-4 mm round, mobile area underneath skin of right temple without TTP.   Wt Readings from Last 3 Encounters:  10/27/21 200 lb (90.7 kg)  10/14/21 196 lb 12.8 oz (89.3 kg)  05/04/21 184 lb 9.6 oz (83.7 kg)    Lab Results  Component Value Date   GLUCOSE 68 (L) 10/14/2021   NA 139 10/14/2021   K 4.2 10/14/2021   CL 104 10/14/2021   CREATININE 0.92 10/14/2021   BUN 11 10/14/2021   CO2 28 10/14/2021   TSH 2.25 05/04/2021    Assessment/Plan:  Hypoglycemia -h/o hypoglycemia as a child -Glucose 68 on labs from 10/14/2021 -Discussed eating regular meals and snacks in between to avoid hypoglycemia as may be contributing to intermittent dizziness. -Patient encouraged to eat prior to her Rugby matches or practice. -consider glucose monitoring for continued or worsened symptoms  Concussion without loss of consciousness, subsequent encounter -resolved -no residual symptoms or deficits noted -Likely hematoma improving -MRI wo contrast 10/25/21 Limited assessment  for the reported pituitary adenoma on this noncontrast routine protocol brain MRI.  However there is a subtle asymmetric fullness of the right aspect of the pituitary gland, and this could reflect the presence of an underlying adenoma at  the site.  Otherwise unremarkable noncontrast MRI appearance of the brain.  Minimal mucosal thickening within bilateral ethmoid and left maxillary sinuses. -note given for return to work.  Pituitary adenoma ([place]) -Stable -Prolactin 30.9 on 10/14/2021, down from 32.0 on 06/29/2021 -Continue cabergoline 0.25 mg -Continue follow-up with endocrinology  F/u prn  Grier Mitts, MD

## 2021-12-24 ENCOUNTER — Telehealth: Payer: Self-pay | Admitting: Family Medicine

## 2021-12-24 NOTE — Telephone Encounter (Signed)
c/o dizziness, lightheadedness, malaise x 5 months.  worried that it may be a glucose problem, wants a fasting glucose test. Offered to schedule appointment, pt declined ?

## 2021-12-28 NOTE — Telephone Encounter (Signed)
ATC pt, no answer, per FYI, left detailed message. Informed pt she would need to come in office due to her symptoms, and wanting a fasting glucose test.  ?

## 2022-05-05 ENCOUNTER — Ambulatory Visit: Payer: BC Managed Care – PPO | Admitting: Internal Medicine

## 2022-10-02 IMAGING — MR MR HEAD W/O CM
11 series · 48 of 48 positions shown · non-contrast
Comparison: No pertinent prior exams available for comparison.

CLINICAL DATA: Provided history: Concussion without loss of
consciousness, initial encounter. The 2 [place] adenoma. Head trauma,
minor, normal mental status; history of pituitary adenoma.
Additional history provided by scanning technologist: Patient
reports concussion without loss of consciousness 10/09/2021,
pituitary adenoma diagnosed in 6429.

EXAM:
MRI HEAD WITHOUT CONTRAST
TECHNIQUE: Multiplanar, multiecho pulse sequences of the brain and surrounding
structures were obtained without intravenous contrast.

[Series 5: T1 · sagittal · 4.0mm · 0.75mm/px · 2 of 30 slices shown (1 of 2)]
[im 1/30]
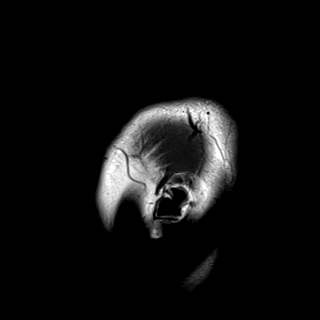
[im 30/30]
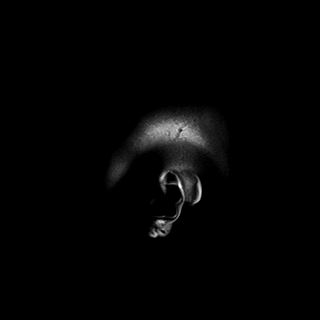

[Series 6: DWI · axial · 3.0mm · 0.94mm/px · z∈[-66,+82]mm · 11 of 168 slices shown (1 of 3)]
[im 1/168]
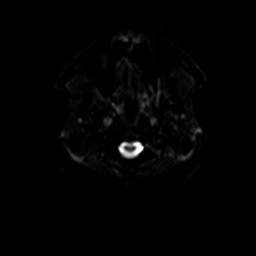
[im 17/168]
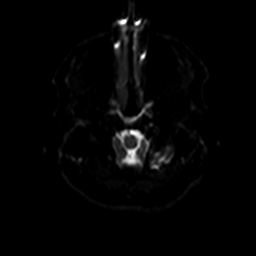
[im 34/168]
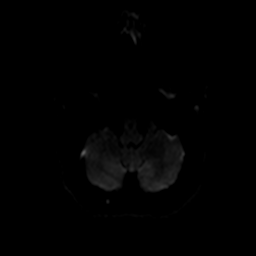
[im 51/168]
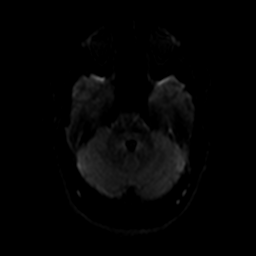
[im 67/168]
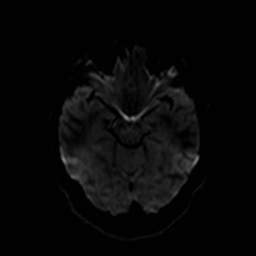
[im 84/168]
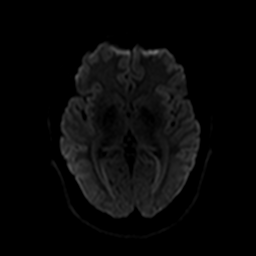
[im 101/168]
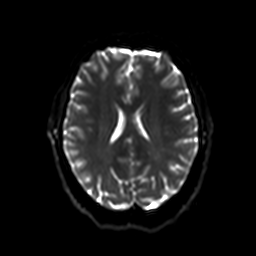
[im 117/168]
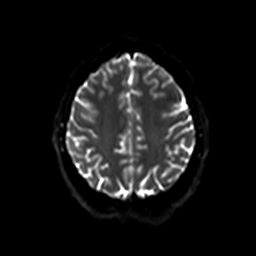
[im 134/168]
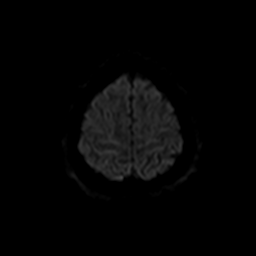
[im 151/168]
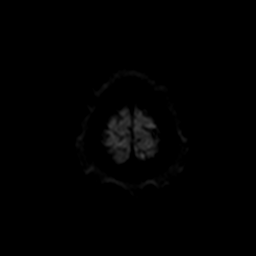
[im 168/168]
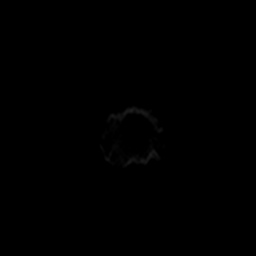

[Series 7: ax dwi_tracew · axial · 3.0mm · 0.94mm/px · z∈[-66,+82]mm · 6 of 84 slices shown]
[im 1/84]
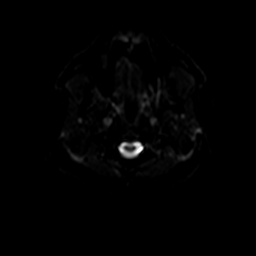
[im 17/84]
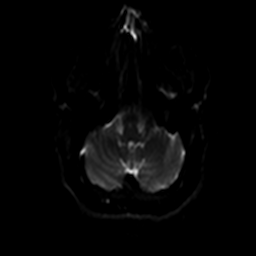
[im 34/84]
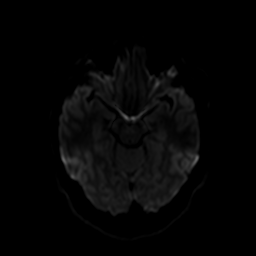
[im 50/84]
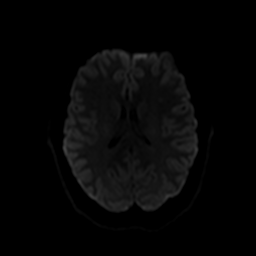
[im 67/84]
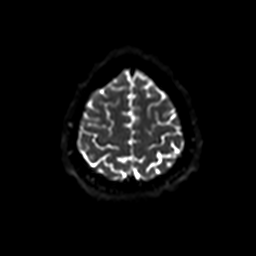
[im 84/84]
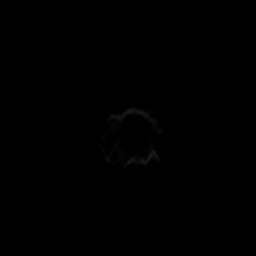

[Series 8: ax dwi_adc · axial · 3.0mm · 0.94mm/px · z∈[-66,+82]mm · 3 of 42 slices shown]
[im 1/42]
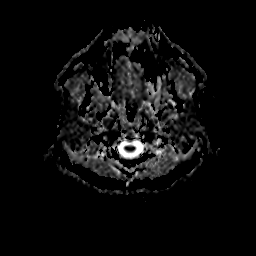
[im 21/42]
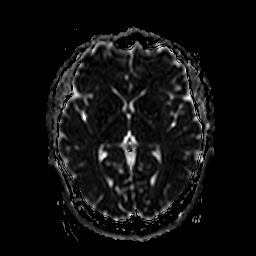
[im 42/42]
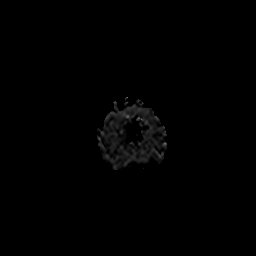

[Series 9: DWI · coronal · 5.0mm · 1.44mm/px · 4 of 64 slices shown (2 of 3)]
[im 1/64]
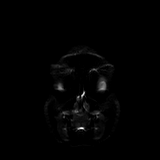
[im 22/64]
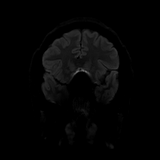
[im 43/64]
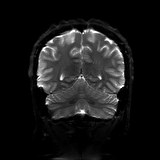
[im 64/64]
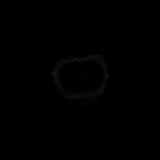

[Series 10: DWI · coronal · 5.0mm · 1.44mm/px · 2 of 32 slices shown (3 of 3)]
[im 1/32]
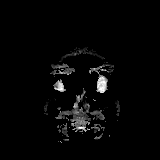
[im 32/32]
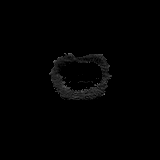

[Series 11: T2 · axial · 4.0mm · 0.36mm/px · z∈[-71,+74]mm · 2 of 29 slices shown (1 of 2)]
[im 1/29]
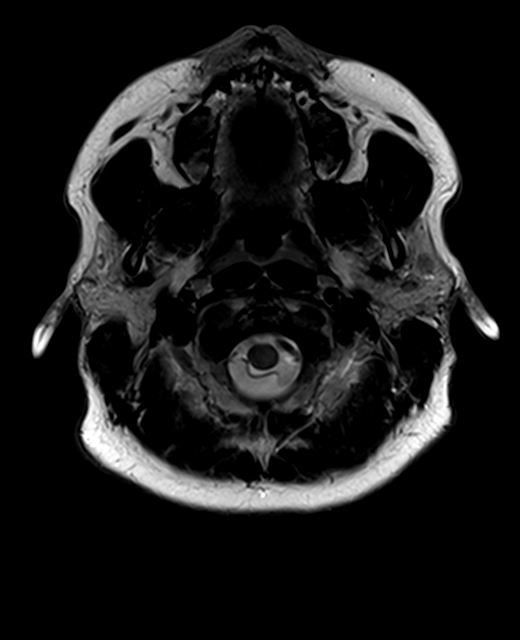
[im 29/29]
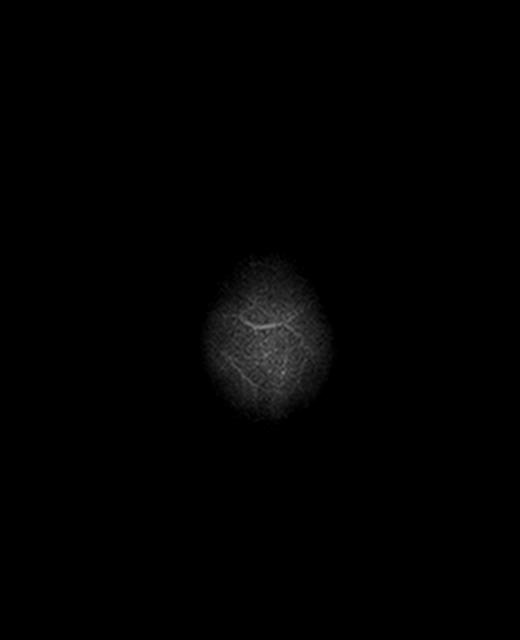

[Series 12: FLAIR · axial · 3.0mm · 0.72mm/px · z∈[-74,+75]mm · 2 of 26 slices shown]
[im 1/26]
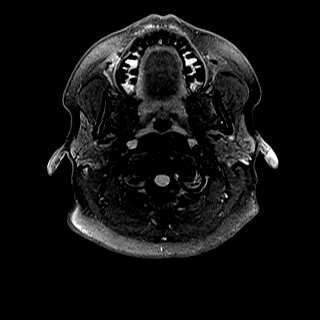
[im 26/26]
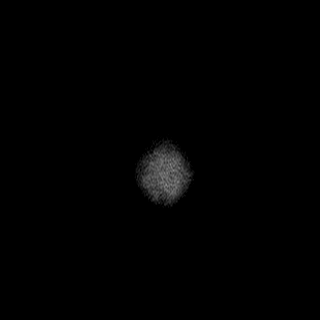

[Series 14: swi_images · axial · 3.0mm · 0.90mm/px · z∈[-74,+78]mm · 3 of 52 slices shown]
[im 1/52]
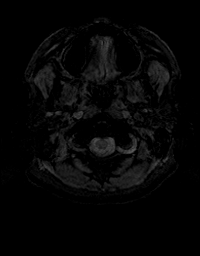
[im 26/52]
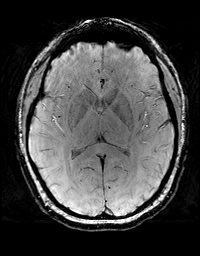
[im 52/52]
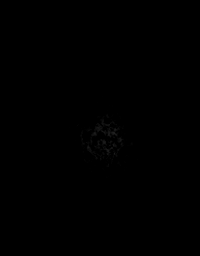

[Series 15: T1 · axial · 1.0mm · 0.90mm/px · z∈[-81,+77]mm · 10 of 160 slices shown (2 of 2)]
[im 1/160]
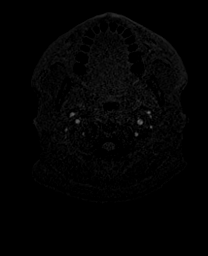
[im 18/160]
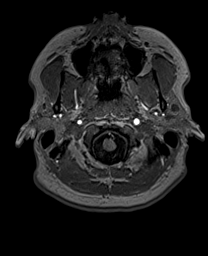
[im 36/160]
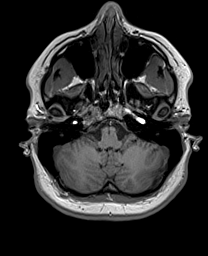
[im 54/160]
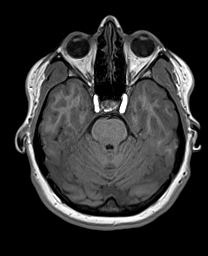
[im 71/160]
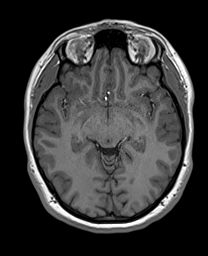
[im 89/160]
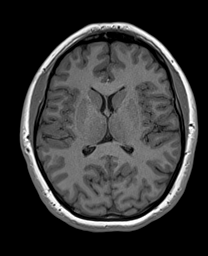
[im 107/160]
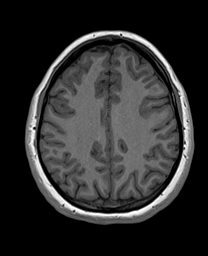
[im 124/160]
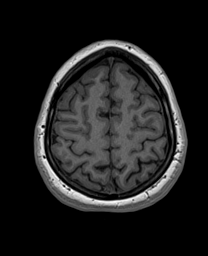
[im 142/160]
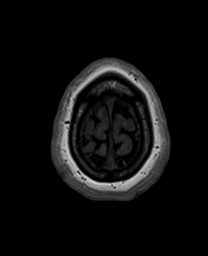
[im 160/160]
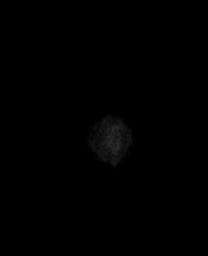

[Series 16: T2 · coronal · 3.0mm · 0.36mm/px · 3 of 45 slices shown (2 of 2)]
[im 1/45]
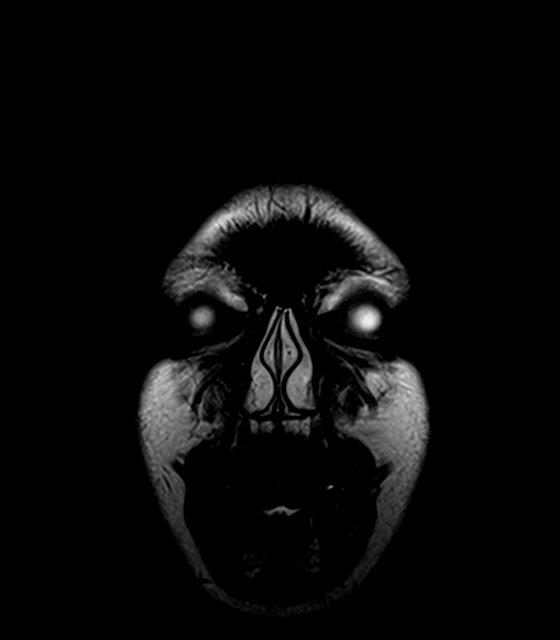
[im 23/45]
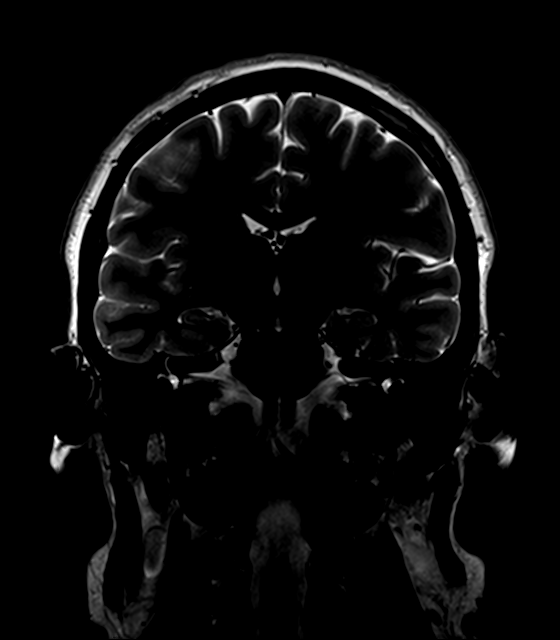
[im 45/45]
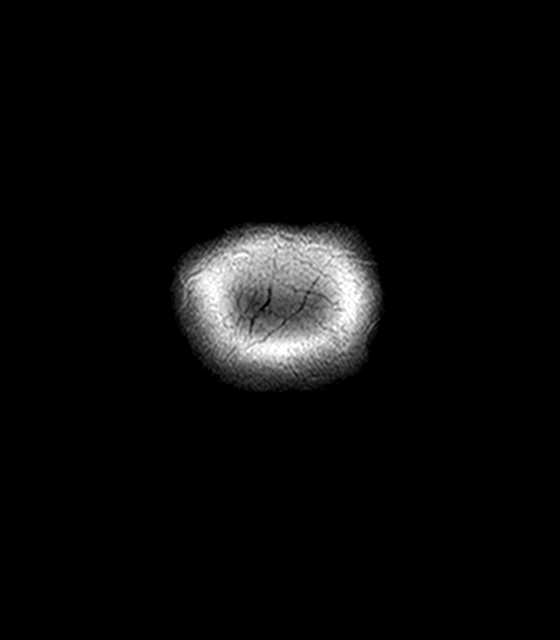

[48 of 48 positions shown; findings below may reference images not displayed]

FINDINGS: Brain:

Cerebral volume is normal.

Please note, there is limited assessment for the reported pituitary
adenoma on this non-contrast routine protocol brain MRI. However,
there is subtle asymmetric fullness of the right aspect of the
pituitary gland, and this could reflect the presence of an
underlying adenoma at this site (for instance as seen on series 16,
image 29).

There is no acute infarct.

No chronic intracranial blood products.

No extra-axial fluid collection.

No midline shift.

Vascular: Maintained flow voids within the proximal large arterial
vessels. Dominant left vertebral artery.

Skull and upper cervical spine: No focal suspicious marrow lesion.

Sinuses/Orbits: Visualized orbits show no acute finding. Minimal
mucosal thickening within the bilateral ethmoid and left maxillary
sinuses.
IMPRESSION: Please note, there is limited assessment for the reported pituitary
adenoma on this non-contrast routine protocol brain MRI. However,
there is subtle asymmetric fullness of the right aspect of the
pituitary gland, and this could reflect the presence of an
underlying adenoma at this site. A pituitary protocol brain MRI with
contrast may be obtained for further evaluation, as clinically
warranted.

Otherwise unremarkable non-contrast MRI appearance of the brain.

Minimal mucosal thickening within the bilateral ethmoid and left
maxillary sinuses.
